# Patient Record
Sex: Female | Born: 1950 | Race: Asian | Hispanic: No | Marital: Married
Health system: Southern US, Community
[De-identification: ages and names within clinical notes are randomized; demographics above are authoritative.]

---

## 2020-07-31 ENCOUNTER — Encounter (HOSPITAL_COMMUNITY): Payer: Self-pay | Admitting: Emergency Medicine

## 2020-07-31 ENCOUNTER — Emergency Department (HOSPITAL_COMMUNITY): Payer: No Typology Code available for payment source

## 2020-07-31 ENCOUNTER — Other Ambulatory Visit: Payer: Self-pay

## 2020-07-31 ENCOUNTER — Emergency Department (HOSPITAL_COMMUNITY)
Admission: EM | Admit: 2020-07-31 | Discharge: 2020-08-01 | Disposition: A | Discharge status: Home / Self Care | Payer: No Typology Code available for payment source | Attending: Emergency Medicine | Admitting: Emergency Medicine

## 2020-07-31 DIAGNOSIS — S0001XA Abrasion of scalp, initial encounter: Secondary | ICD-10-CM

## 2020-07-31 DIAGNOSIS — S0990XA Unspecified injury of head, initial encounter: Secondary | ICD-10-CM | POA: Diagnosis present

## 2020-07-31 DIAGNOSIS — R519 Headache, unspecified: Secondary | ICD-10-CM

## 2020-07-31 DIAGNOSIS — I615 Nontraumatic intracerebral hemorrhage, intraventricular: Secondary | ICD-10-CM | POA: Diagnosis not present

## 2020-07-31 DIAGNOSIS — S0003XA Contusion of scalp, initial encounter: Secondary | ICD-10-CM | POA: Diagnosis not present
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
## 2020-07-31 NOTE — ED Notes (Signed)
Patient transported to CT 
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
## 2022-06-22 IMAGING — CT CT HEAD W/O CM
4 series · 16 of 47 positions shown, 18 images · non-contrast
Comparison: CT brain 07/31/2020

CLINICAL DATA: Headache follow-up bleed

EXAM:
CT HEAD WITHOUT CONTRAST
TECHNIQUE: Contiguous axial images were obtained from the base of the skull
through the vertex without intravenous contrast.

[Series 3: head without · axial · non-contrast · 0.39mm/px · z∈[-123,-3]mm · 7 of 32 slices shown, 9 images]
[im 4/32  brain]
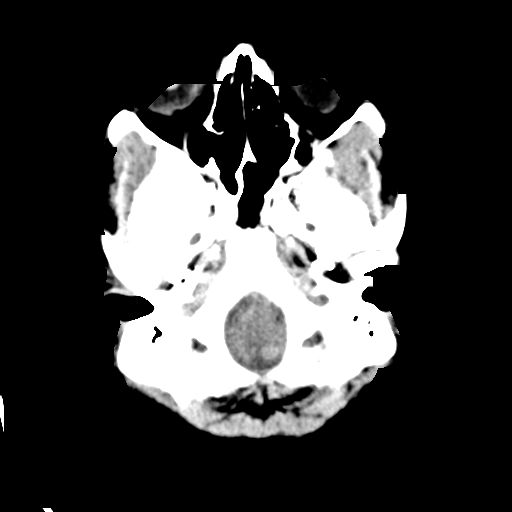
[im 4/32  bone]
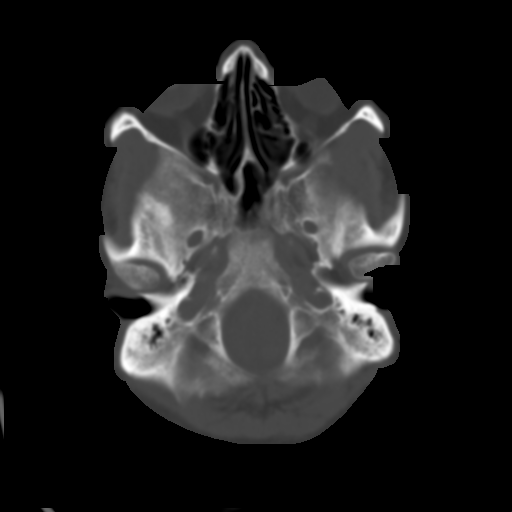
[im 8/32  brain]
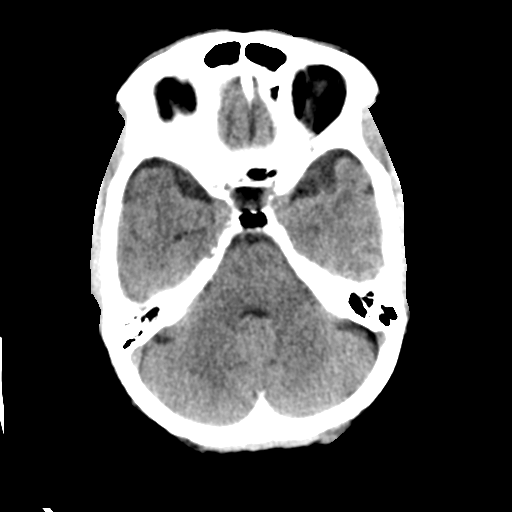
[im 12/32  brain]
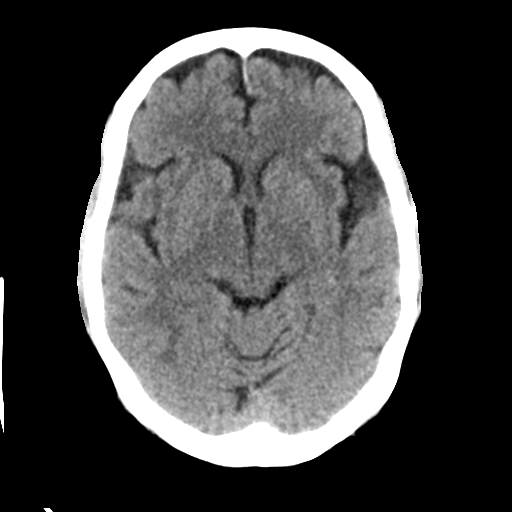
[im 16/32  brain]
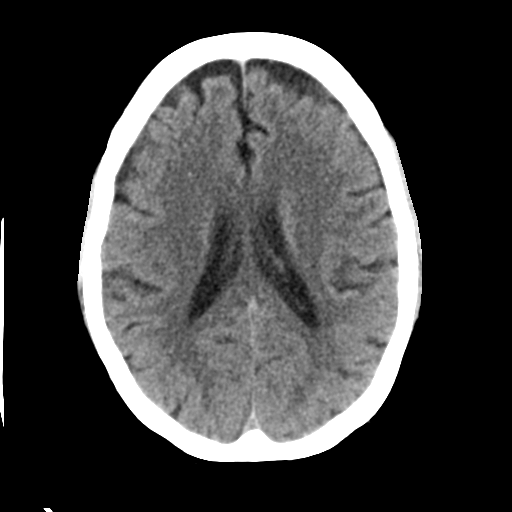
[im 20/32  brain]
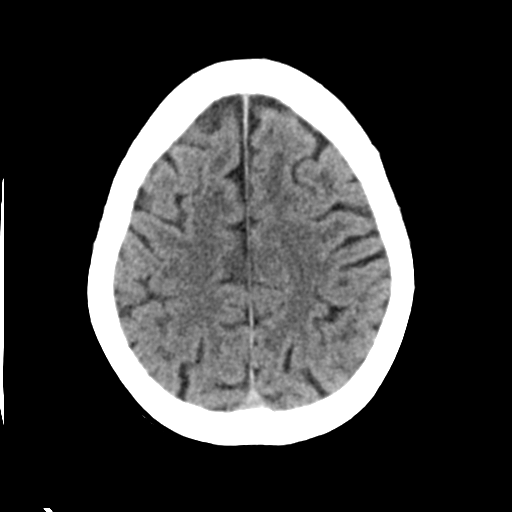
[im 20/32  bone]
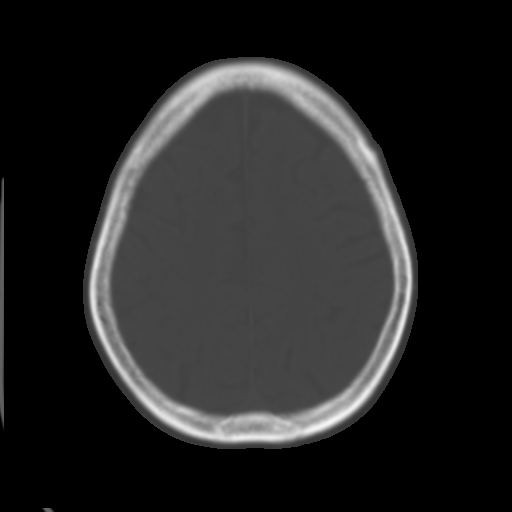
[im 24/32  brain]
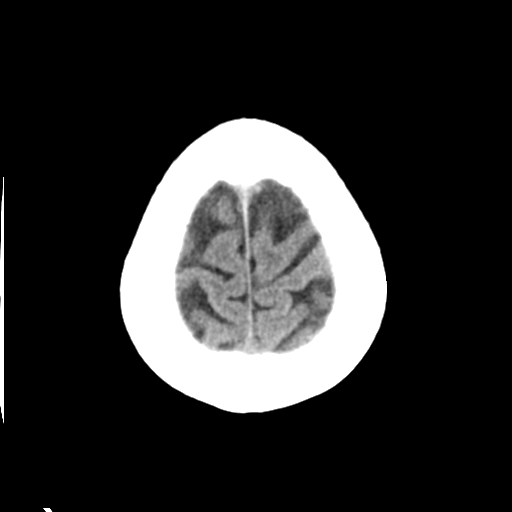
[im 28/32  brain]
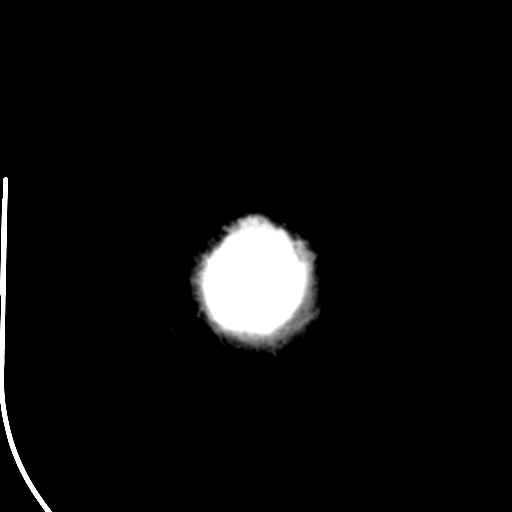

[Series 4: head bone · axial · 0.39mm/px · z∈[-124,-92]mm · 3 of 80 slices shown]
[im 8/80  bone]
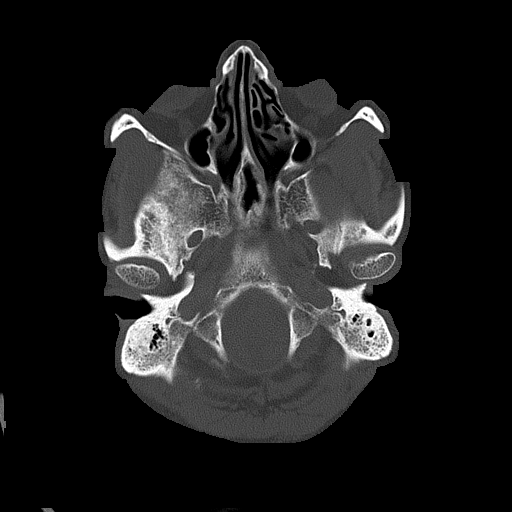
[im 16/80  bone]
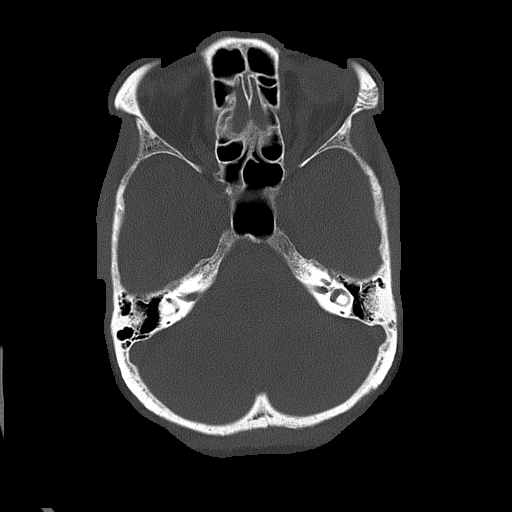
[im 24/80  bone]
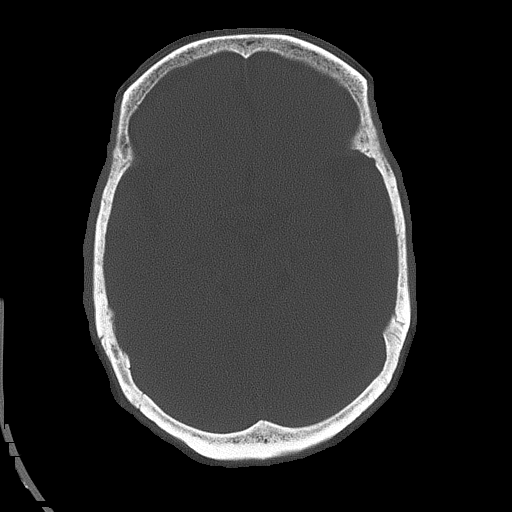

[Series 5: head without cor · coronal · non-contrast · 0.30mm/px · 3 of 67 slices shown]
[im 23/67  brain]
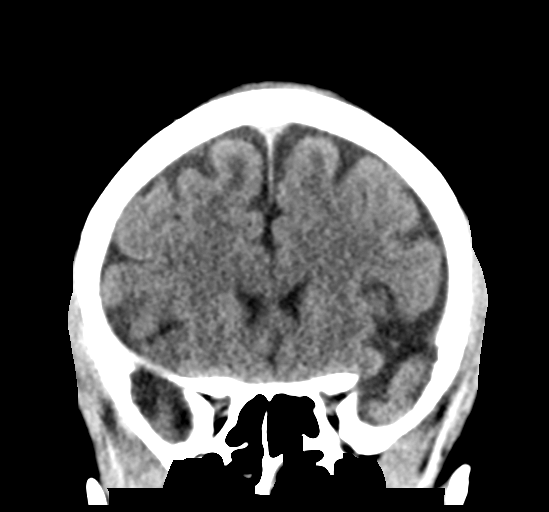
[im 30/67  brain]
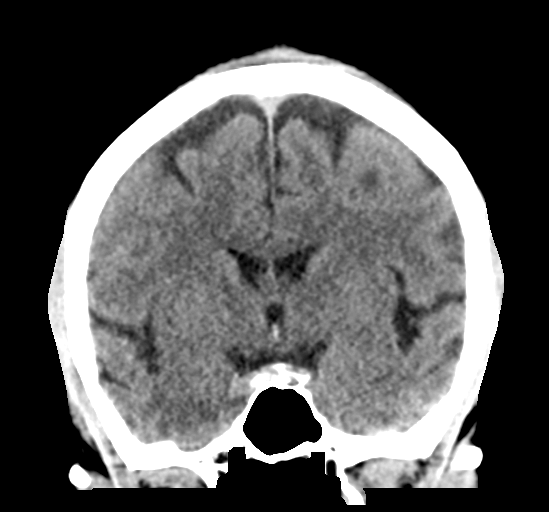
[im 37/67  brain]
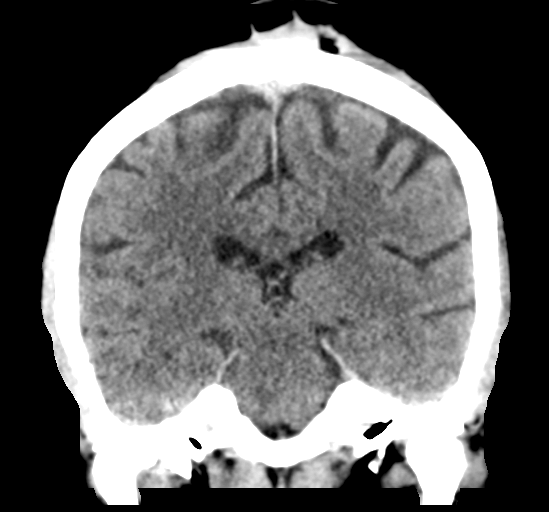

[Series 6: head without sag · sagittal · non-contrast · 0.29mm/px · 3 of 55 slices shown]
[im 19/55  brain]
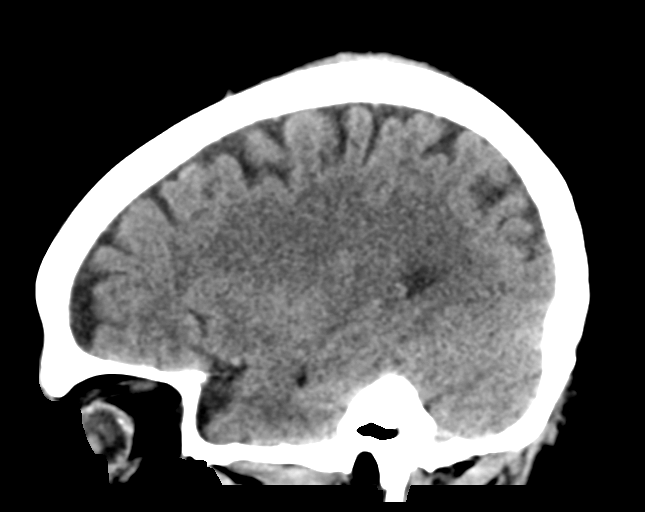
[im 28/55  brain]
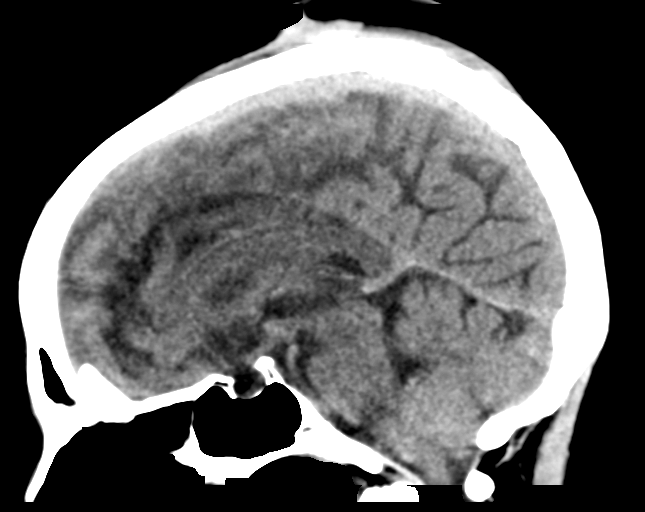
[im 37/55  brain]
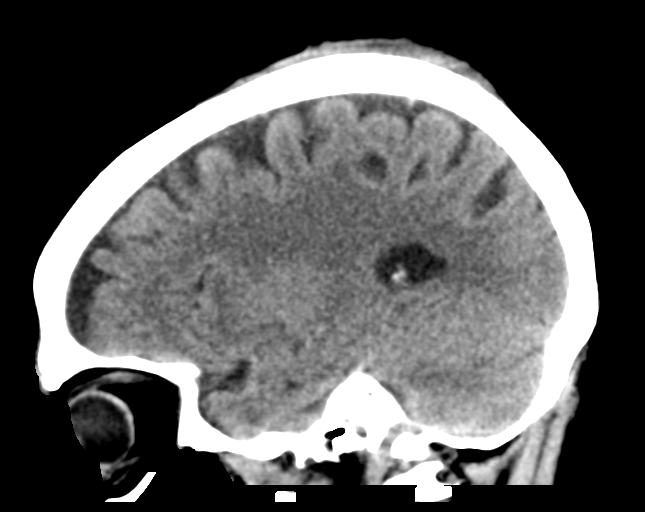

[16 of 47 positions shown; findings below may reference images not displayed]

FINDINGS: Brain: No acute territorial infarction or intracranial mass is
visualized. Small subcortical focus of hypodensity in the left
parasagittal frontal lobe again noted. Trace hyperdensity within the
third ventricle is stable to slightly diminished. There is no new
hemorrhage identified. The ventricles are nonenlarged.

Vascular: No hyperdense vessels.  No unexpected calcification

Skull: Normal. Negative for fracture or focal lesion.

Sinuses/Orbits: Deformity of the right globe with punctate foci of
internal density.

Other: Scalp hematoma and laceration at the cranial vertex.
IMPRESSION: 1. Trace hyperdensity/small volume presumed hemorrhage within the
third ventricle is stable to slightly diminished. No new hemorrhage
is identified.
2. Small subcortical focus of hypodensity in the left parasagittal
frontal lobe, question small vessel ischemic focus. No change.
3. Deformity of the right globe with punctate foci of internal
density.
